# Patient Record
Sex: Female | Born: 1950 | Race: Black or African American | Hispanic: No | Marital: Married | Smoking: Never smoker
Health system: Southern US, Community
[De-identification: ages and names within clinical notes are randomized; demographics above are authoritative.]

## PROBLEM LIST (undated history)

## (undated) DIAGNOSIS — M7540 Impingement syndrome of unspecified shoulder: Secondary | ICD-10-CM

## (undated) DIAGNOSIS — M25819 Other specified joint disorders, unspecified shoulder: Secondary | ICD-10-CM

## (undated) DIAGNOSIS — N189 Chronic kidney disease, unspecified: Secondary | ICD-10-CM

## (undated) DIAGNOSIS — E119 Type 2 diabetes mellitus without complications: Secondary | ICD-10-CM

## (undated) HISTORY — PX: CHOLECYSTECTOMY: SHX55

## (undated) HISTORY — PX: ROTATOR CUFF REPAIR: SHX139

## (undated) HISTORY — PX: ABDOMINAL HYSTERECTOMY: SHX81

## (undated) HISTORY — DX: Type 2 diabetes mellitus without complications: E11.9

---

## 1998-08-12 ENCOUNTER — Encounter: Payer: Self-pay | Admitting: General Surgery

## 1998-08-16 ENCOUNTER — Ambulatory Visit (HOSPITAL_COMMUNITY): Admission: RE | Admit: 1998-08-16 | Discharge: 1998-08-17 | Payer: Self-pay | Admitting: General Surgery

## 1999-09-16 ENCOUNTER — Encounter: Payer: Self-pay | Admitting: Emergency Medicine

## 1999-09-16 ENCOUNTER — Emergency Department (HOSPITAL_COMMUNITY): Admission: EM | Admit: 1999-09-16 | Discharge: 1999-09-16 | Payer: Self-pay | Admitting: Emergency Medicine

## 2002-10-31 ENCOUNTER — Other Ambulatory Visit: Admission: RE | Admit: 2002-10-31 | Discharge: 2002-10-31 | Payer: Self-pay | Admitting: Internal Medicine

## 2003-08-03 ENCOUNTER — Encounter: Admission: RE | Admit: 2003-08-03 | Discharge: 2003-11-01 | Payer: Self-pay | Admitting: Internal Medicine

## 2003-08-19 ENCOUNTER — Encounter: Admission: RE | Admit: 2003-08-19 | Discharge: 2003-08-19 | Payer: Self-pay | Admitting: Internal Medicine

## 2003-12-29 ENCOUNTER — Ambulatory Visit (HOSPITAL_BASED_OUTPATIENT_CLINIC_OR_DEPARTMENT_OTHER): Admission: RE | Admit: 2003-12-29 | Discharge: 2003-12-29 | Payer: Self-pay | Admitting: Orthopaedic Surgery

## 2003-12-29 ENCOUNTER — Ambulatory Visit (HOSPITAL_COMMUNITY): Admission: RE | Admit: 2003-12-29 | Discharge: 2003-12-29 | Payer: Self-pay | Admitting: Orthopaedic Surgery

## 2006-06-18 ENCOUNTER — Ambulatory Visit (HOSPITAL_COMMUNITY): Admission: RE | Admit: 2006-06-18 | Discharge: 2006-06-18 | Payer: Self-pay | Admitting: Internal Medicine

## 2006-06-29 ENCOUNTER — Encounter: Admission: RE | Admit: 2006-06-29 | Discharge: 2006-06-29 | Payer: Self-pay | Admitting: Internal Medicine

## 2007-08-29 ENCOUNTER — Ambulatory Visit (HOSPITAL_COMMUNITY): Admission: RE | Admit: 2007-08-29 | Discharge: 2007-08-29 | Payer: Self-pay | Admitting: Gastroenterology

## 2008-06-30 ENCOUNTER — Encounter: Admission: RE | Admit: 2008-06-30 | Discharge: 2008-06-30 | Payer: Self-pay | Admitting: Internal Medicine

## 2008-10-11 ENCOUNTER — Emergency Department (HOSPITAL_COMMUNITY): Admission: EM | Admit: 2008-10-11 | Discharge: 2008-10-12 | Payer: Self-pay | Admitting: Emergency Medicine

## 2009-12-16 ENCOUNTER — Emergency Department (HOSPITAL_COMMUNITY): Admission: EM | Admit: 2009-12-16 | Discharge: 2009-12-16 | Payer: Self-pay | Admitting: Emergency Medicine

## 2010-02-07 ENCOUNTER — Ambulatory Visit (HOSPITAL_COMMUNITY): Admission: EM | Admit: 2010-02-07 | Discharge: 2010-02-07 | Payer: Self-pay | Admitting: Emergency Medicine

## 2010-02-07 ENCOUNTER — Emergency Department (HOSPITAL_COMMUNITY)
Admission: EM | Admit: 2010-02-07 | Discharge: 2010-02-07 | Payer: Self-pay | Source: Home / Self Care | Admitting: Emergency Medicine

## 2010-07-02 ENCOUNTER — Encounter: Payer: Self-pay | Admitting: Internal Medicine

## 2010-07-03 ENCOUNTER — Encounter: Payer: Self-pay | Admitting: Internal Medicine

## 2010-08-26 LAB — CBC
HCT: 35.2 % — ABNORMAL LOW (ref 36.0–46.0)
Hemoglobin: 11.9 g/dL — ABNORMAL LOW (ref 12.0–15.0)
MCHC: 33.8 g/dL (ref 30.0–36.0)
RBC: 3.96 MIL/uL (ref 3.87–5.11)
RDW: 12.9 % (ref 11.5–15.5)

## 2010-08-26 LAB — DIFFERENTIAL
Basophils Absolute: 0.1 10*3/uL (ref 0.0–0.1)
Eosinophils Absolute: 0.2 10*3/uL (ref 0.0–0.7)
Eosinophils Relative: 2 % (ref 0–5)
Lymphocytes Relative: 47 % — ABNORMAL HIGH (ref 12–46)
Monocytes Absolute: 0.7 10*3/uL (ref 0.1–1.0)
Monocytes Relative: 7 % (ref 3–12)
Neutro Abs: 4.5 10*3/uL (ref 1.7–7.7)

## 2010-08-26 LAB — BASIC METABOLIC PANEL
CO2: 26 mEq/L (ref 19–32)
Chloride: 108 mEq/L (ref 96–112)
Creatinine, Ser: 0.95 mg/dL (ref 0.4–1.2)

## 2010-08-26 LAB — URINALYSIS, ROUTINE W REFLEX MICROSCOPIC
Bilirubin Urine: NEGATIVE
Glucose, UA: NEGATIVE mg/dL
Ketones, ur: NEGATIVE mg/dL
Urobilinogen, UA: 1 mg/dL (ref 0.0–1.0)

## 2010-08-28 LAB — BASIC METABOLIC PANEL
BUN: 15 mg/dL (ref 6–23)
Calcium: 9.1 mg/dL (ref 8.4–10.5)
Chloride: 107 mEq/L (ref 96–112)
GFR calc non Af Amer: >60 mL/min (ref >60)
Glucose, Bld: 186 mg/dL — ABNORMAL HIGH (ref 70–99)
Sodium: 141 mEq/L (ref 135–145)

## 2010-08-28 LAB — URINALYSIS, ROUTINE W REFLEX MICROSCOPIC
Bilirubin Urine: NEGATIVE
Ketones, ur: NEGATIVE mg/dL
Protein, ur: NEGATIVE mg/dL
Urobilinogen, UA: 0.2 mg/dL (ref 0.0–1.0)

## 2010-08-28 LAB — DIFFERENTIAL
Basophils Absolute: 0.1 10*3/uL (ref 0.0–0.1)
Eosinophils Relative: 1 % (ref 0–5)
Neutro Abs: 10.2 10*3/uL — ABNORMAL HIGH (ref 1.7–7.7)

## 2010-08-28 LAB — CBC
HCT: 34.5 % — ABNORMAL LOW (ref 36.0–46.0)
RBC: 3.89 MIL/uL (ref 3.87–5.11)

## 2010-08-28 LAB — URINE MICROSCOPIC-ADD ON

## 2010-09-20 LAB — URINE MICROSCOPIC-ADD ON

## 2010-09-20 LAB — POCT I-STAT, CHEM 8
BUN: 14 mg/dL (ref 6–23)
Calcium, Ion: 1.19 mmol/L (ref 1.12–1.32)
Chloride: 103 mEq/L (ref 96–112)
Creatinine, Ser: 1 mg/dL (ref 0.4–1.2)
Glucose, Bld: 130 mg/dL — ABNORMAL HIGH (ref 70–99)
HCT: 39 % (ref 36.0–46.0)
Potassium: 3.4 mEq/L — ABNORMAL LOW (ref 3.5–5.1)
TCO2: 29 mmol/L (ref 0–100)

## 2010-09-20 LAB — URINALYSIS, ROUTINE W REFLEX MICROSCOPIC
Bilirubin Urine: NEGATIVE
Glucose, UA: NEGATIVE mg/dL
Ketones, ur: NEGATIVE mg/dL
Protein, ur: 30 mg/dL — AB
Specific Gravity, Urine: 1.035 — ABNORMAL HIGH (ref 1.005–1.030)
pH: 5 (ref 5.0–8.0)

## 2010-10-28 NOTE — Op Note (Signed)
Heidi Powers, Heidi Powers                       ACCOUNT NO.:  [id number]   MEDICAL RECORD NO.:  [id number]                   PATIENT TYPE:  AMB   LOCATION:  DSC                                  FACILITY:  [facility]   PHYSICIAN:  Lubertha Basque. Jerl Santos, M.D.             DATE OF BIRTH:  May 19, 1951   DATE OF PROCEDURE:  12/29/2003  DATE OF DISCHARGE:                                 OPERATIVE REPORT   PREOPERATIVE DIAGNOSES:  1. Right shoulder impingement.  2. Right shoulder acromioclavicular joint degeneration.  3. Right shoulder rotator cuff tear.   POSTOPERATIVE DIAGNOSES:  1. Right shoulder impingement.  2. Right shoulder acromioclavicular joint degeneration.  3. Right shoulder rotator cuff tear.  4. Biceps degeneration.   PROCEDURES:  1. Right shoulder arthroscopic acromioplasty.  2. Right shoulder arthroscopic acromioclavicular joint resection.  3. Right shoulder arthroscopic rotator cuff repair.  4. Right shoulder arthroscopic biceps tenotomy.   ANESTHESIA:  General and block.   ATTENDING SURGEON:  Lubertha Basque. Jerl Santos, M.D.   ASSISTANT:  Lindwood Qua, P.A.   INDICATION FOR PROCEDURE:  The patient is a 60 year old woman with many  months of intense right shoulder pain.  This has persisted despite oral anti-  inflammatories and an exercise program.  She has achieved no relief with a  subacromial injection.  An MRI scan shows a small supraspinatus rotator cuff  tear, complete, along with AC degeneration and impingement.  With pain at  rest and pain with activity, she is offered an arthroscopy.  Informed  operative consent was obtained after a discussion of possible complications  of, reaction to anesthesia, and infection.   DESCRIPTION OF PROCEDURE:  The patient was taken to an operating suite,  where a general anesthetic was applied without difficulty.  She was also  given a block in the preanesthesia area.  She was positioned in beach chair  position and prepped and draped in  a normal sterile fashion.  After the  administration of preop Clindamycin, an arthroscopy was done through a total  of three portals.  Glenohumeral joint showed no degenerative changes, while  the biceps tendon appeared significantly degenerative throughout the intra-  articular portion.  There was a longitudinal split in this tendon.  I  attempted a debridement but was left with about 50% of the thickness of the  tendon as being relatively healthy.  As she achieved no relief with her  subacromial injection, it seemed wise to go ahead and perform a tenotomy,  releasing the biceps tendon.  A shaver was used and this easily was  performed.  The rotator cuff did appear to have a small tear near the  interval between the supraspinatus and subscapularis tendons.  In the  subacromial space she had things consistent with impingement, and an  acromioplasty was done with the bur in the lateral position followed by  transfer of the bur to the posterior position.  She also had bony contact  at  the [facility] joint and a formal ACD compression was done through an anterior  portal with an arthroscopic bur.  The rotator cuff tear was small and could  be seen from above.  A bleeding bed of bone was created with a bur, followed  by the use of one of the Arthrex parachute anchors to repair this tendon to  the humeral head at the greater tuberosity.  The shoulder was thoroughly  irrigated, followed by placement of Marcaine with epinephrine.  Simple  sutures of nylon were used to loosely reapproximate the portals, followed by  Adaptic and a dry gauze dressing with tape.  Estimated blood loss and  intraoperative fluids can be obtained from anesthesia records.   DISPOSITION:  The patient was extubated in the operating room and taken to  the recovery room in stable condition.  Plans were for her to go home the  same day and follow up in less than a week.  I will contact her by phone  tonight.                                                Lubertha Basque Jerl Santos, M.D.    PGD/MEDQ  D:  12/29/2003  T:  12/29/2003  Job:  [id number]

## 2010-12-30 ENCOUNTER — Other Ambulatory Visit: Payer: Self-pay | Admitting: Internal Medicine

## 2010-12-30 DIAGNOSIS — Z1231 Encounter for screening mammogram for malignant neoplasm of breast: Secondary | ICD-10-CM

## 2011-01-20 ENCOUNTER — Ambulatory Visit: Payer: Self-pay

## 2012-02-19 ENCOUNTER — Ambulatory Visit: Payer: Self-pay

## 2012-03-01 ENCOUNTER — Ambulatory Visit
Admission: RE | Admit: 2012-03-01 | Discharge: 2012-03-01 | Disposition: A | Discharge status: Home / Self Care | Payer: BC Managed Care – PPO | Source: Ambulatory Visit | Attending: Internal Medicine | Admitting: Internal Medicine

## 2012-03-01 DIAGNOSIS — Z1231 Encounter for screening mammogram for malignant neoplasm of breast: Secondary | ICD-10-CM

## 2013-02-25 ENCOUNTER — Other Ambulatory Visit: Payer: Self-pay

## 2013-02-25 DIAGNOSIS — Z1231 Encounter for screening mammogram for malignant neoplasm of breast: Secondary | ICD-10-CM

## 2013-03-20 ENCOUNTER — Ambulatory Visit
Admission: RE | Admit: 2013-03-20 | Discharge: 2013-03-20 | Disposition: A | Discharge status: Home / Self Care | Payer: BC Managed Care – PPO | Source: Ambulatory Visit

## 2013-03-20 ENCOUNTER — Ambulatory Visit: Payer: BC Managed Care – PPO

## 2013-03-20 DIAGNOSIS — Z1231 Encounter for screening mammogram for malignant neoplasm of breast: Secondary | ICD-10-CM

## 2014-06-17 ENCOUNTER — Encounter: Payer: 59 | Attending: Internal Medicine | Admitting: Dietician

## 2014-06-17 ENCOUNTER — Encounter: Payer: Self-pay | Admitting: Dietician

## 2014-06-17 VITALS — Ht 60.5 in | Wt 166.0 lb

## 2014-06-17 DIAGNOSIS — Z6831 Body mass index (BMI) 31.0-31.9, adult: Secondary | ICD-10-CM | POA: Diagnosis not present

## 2014-06-17 DIAGNOSIS — E669 Obesity, unspecified: Secondary | ICD-10-CM | POA: Diagnosis not present

## 2014-06-17 DIAGNOSIS — Z713 Dietary counseling and surveillance: Secondary | ICD-10-CM | POA: Diagnosis not present

## 2014-06-17 NOTE — Progress Notes (Signed)
  Medical Nutrition Therapy:  Appt start time: 0810end time:  0910.   Assessment:  Primary concerns: Heidi Powers is here today regarding her diagnosis of pre-diabetes with an elevated HgbA1c of 6.2 in November. She has made several changes since November including cutting out lemonade, and not eating at [place]. She has stopped eating her sweet potato pies. She is getting grilled options and diet lemonade at restaurants. She has lost 4lbs over the last 3 months. She works 3p-11, Monday and Tuesday and is retired. She does the food shopping and her husband does the food preparation. Her husband has diabetes.   Preferred Learning Style:   No preference indicated   Learning Readiness:   Ready  MEDICATIONS: see list   DIETARY INTAKE:  Usual eating pattern includes 3 meals and 0-1 snacks per day.  Avoided foods include cottage cheese.    24-hr recall:  B ( AM): not hungry til 11- eats cereal or [place]: biscuit with egg and bacon with small orange juice (sometimes)  Snk ( AM): none but rarely  L ( PM): subway 6 inch roast beef and light mayo Snk ( PM): lean shake D ( PM): pita delite salad with less dressing or salmon with greens or string beans Snk ( PM): sometimes a banana or peeled apples, pickles Beverages: water now   Usual physical activity: none now  Estimated energy needs: 1600 calories 180 g carbohydrates 120 g protein 44 g fat  Progress Towards Goal(s):  In progress.   Nutritional Diagnosis:  NB-1.1 Food and nutrition-related knowledge deficit As related to history of excess consumption of carbohydrates and lack of exercise.  As evidenced by her 24 hour diet recall, self report on activity and laboratory values.    Intervention:  Nutrition counseling and education provided: Goals:  Follow Diabetes Meal Plan as instructed  Eat 3 meals and 0-2 snacks, every 3-5 hrs  Limit carbohydrate intake to 2-3 carb choices (30-45 grams) carbohydrate per meal  Limit  carbohydrate intake to 0-1 carb choices (0-15 grams) carbohydrate per snack  Add lean protein foods to meals and snacks  Aim for 15 mins of walking or bike 3 times per week and gradually increase as tolerated  Teaching Method Utilized:  Visual Auditory Hands on  Handouts given during visit include:  Yellow Card  *Living Well with Diabetes  MyPlate  Blood Glucose Monitoring  15g CHO sheet  Barriers to learning/adherence to lifestyle change: none  Demonstrated degree of understanding via:  Teach Back   Monitoring/Evaluation:  Dietary intake, exercise, laboratory values, and body weight in 3 month(s).

## 2014-06-17 NOTE — Patient Instructions (Signed)
Goals:  Follow Diabetes Meal Plan as instructed  Eat 3 meals and 0-2 snacks, every 3-5 hrs  Limit carbohydrate intake to 2-3 carb choices (30-45 grams) carbohydrate per meal  Limit carbohydrate intake to 0-1 carb choices (0-15 grams) carbohydrate per snack  Add lean protein foods to meals and snacks  Aim for 15 mins of walking or bike 3 times per week and gradually increase as tolerated

## 2014-09-16 ENCOUNTER — Ambulatory Visit: Payer: 59 | Admitting: Dietician

## 2014-11-20 ENCOUNTER — Other Ambulatory Visit: Payer: Self-pay | Admitting: Orthopaedic Surgery

## 2014-12-03 ENCOUNTER — Other Ambulatory Visit: Payer: Self-pay

## 2014-12-03 DIAGNOSIS — Z1231 Encounter for screening mammogram for malignant neoplasm of breast: Secondary | ICD-10-CM

## 2014-12-08 ENCOUNTER — Encounter (HOSPITAL_BASED_OUTPATIENT_CLINIC_OR_DEPARTMENT_OTHER): Payer: Self-pay | Admitting: *Deleted

## 2014-12-09 ENCOUNTER — Ambulatory Visit
Admission: RE | Admit: 2014-12-09 | Discharge: 2014-12-09 | Disposition: A | Discharge status: Home / Self Care | Payer: 59 | Source: Ambulatory Visit

## 2014-12-09 DIAGNOSIS — Z1231 Encounter for screening mammogram for malignant neoplasm of breast: Secondary | ICD-10-CM

## 2014-12-09 NOTE — H&P (Signed)
Heidi Powers is an 64 y.o. female.   Chief Complaint: Left shoulder pain HPI: Heidi Powers continues over a long period of time with terrible left shoulder pain.  Worse in the anterior aspect.  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  MRI scan dated 10/29/14 shows some irritation of the rotator cuff and the complete biceps tear.  We have discussed proceeding with an arthroscopy and try to calm this down and make herMore comfortable with activities of daily living  Past Medical History  Diagnosis Date  . Chronic kidney disease     H/O kidney stones  . Shoulder impingement     left    Past Surgical History  Procedure Laterality Date  . Abdominal hysterectomy    . Cholecystectomy    . Rotator cuff repair Right     History reviewed. No pertinent family history. Social History:  reports that she has never smoked. She does not have any smokeless tobacco history on file. She reports that she does not drink alcohol or use illicit drugs.  Allergies:  Allergies  Allergen Reactions  . Penicillins     No prescriptions prior to admission    No results found for this or any previous visit (from the past 48 hour(s)). No results found.  Review of Systems  Constitutional: Negative.   HENT: Negative.   Eyes: Negative.   Respiratory: Negative.   Cardiovascular: Negative.   Gastrointestinal: Negative.   Genitourinary: Negative.   Musculoskeletal: Positive for joint pain.  Skin: Negative.   Neurological: Negative.   Endo/Heme/Allergies: Negative.   Psychiatric/Behavioral: Negative.     Height 5\' 1"  (1.549 m), weight 74.844 kg (165 lb). Physical Exam  Constitutional: She appears well-nourished.  HENT:  Head: Atraumatic.  Eyes: Conjunctivae are normal.  Neck: Normal range of motion.  Cardiovascular: Normal rate and normal heart sounds.    Respiratory: Effort normal.  GI: Soft.  Musculoskeletal:  Left shoulder exam motion is relatively good 1:30, 45, L1 positive impingement in both positions and some pain at the biceps region anteriorly cuff is strong but painful.  No a.c. pain  Neurological: She is alert.  Skin: Skin is warm.  Psychiatric: She has a normal mood and affect.     Assessment/Plan Assessment: Left shoulder impingement and biceps tear with secondary adhesive capsulitis injected 09/23/14 Plan: We have discussed with her going forward with manipulation and arthroscopy that include the risk of anesthesia and infection and need for outpatient physical therapy postoperatively to maximize the results.  Aldric Wenzler R 12/09/2014, 12:01 PM

## 2014-12-11 ENCOUNTER — Encounter (HOSPITAL_BASED_OUTPATIENT_CLINIC_OR_DEPARTMENT_OTHER): Payer: Self-pay | Admitting: *Deleted

## 2014-12-11 ENCOUNTER — Ambulatory Visit (HOSPITAL_BASED_OUTPATIENT_CLINIC_OR_DEPARTMENT_OTHER): Payer: 59 | Admitting: Anesthesiology

## 2014-12-11 ENCOUNTER — Encounter (HOSPITAL_BASED_OUTPATIENT_CLINIC_OR_DEPARTMENT_OTHER)
Admission: RE | Disposition: A | Discharge status: Home / Self Care | Payer: Self-pay | Source: Ambulatory Visit | Attending: Orthopaedic Surgery

## 2014-12-11 ENCOUNTER — Ambulatory Visit (HOSPITAL_BASED_OUTPATIENT_CLINIC_OR_DEPARTMENT_OTHER)
Admission: RE | Admit: 2014-12-11 | Discharge: 2014-12-11 | Disposition: A | Discharge status: Home / Self Care | Payer: 59 | Source: Ambulatory Visit | Attending: Orthopaedic Surgery | Admitting: Orthopaedic Surgery

## 2014-12-11 DIAGNOSIS — Y939 Activity, unspecified: Secondary | ICD-10-CM | POA: Diagnosis not present

## 2014-12-11 DIAGNOSIS — S46222A Laceration of muscle, fascia and tendon of other parts of biceps, left arm, initial encounter: Secondary | ICD-10-CM | POA: Insufficient documentation

## 2014-12-11 DIAGNOSIS — M7502 Adhesive capsulitis of left shoulder: Secondary | ICD-10-CM | POA: Insufficient documentation

## 2014-12-11 DIAGNOSIS — Y999 Unspecified external cause status: Secondary | ICD-10-CM | POA: Diagnosis not present

## 2014-12-11 DIAGNOSIS — M94212 Chondromalacia, left shoulder: Secondary | ICD-10-CM | POA: Diagnosis not present

## 2014-12-11 DIAGNOSIS — N189 Chronic kidney disease, unspecified: Secondary | ICD-10-CM | POA: Insufficient documentation

## 2014-12-11 DIAGNOSIS — M25812 Other specified joint disorders, left shoulder: Secondary | ICD-10-CM | POA: Diagnosis present

## 2014-12-11 DIAGNOSIS — Y929 Unspecified place or not applicable: Secondary | ICD-10-CM | POA: Diagnosis not present

## 2014-12-11 DIAGNOSIS — X58XXXA Exposure to other specified factors, initial encounter: Secondary | ICD-10-CM | POA: Diagnosis not present

## 2014-12-11 HISTORY — DX: Chronic kidney disease, unspecified: N18.9

## 2014-12-11 HISTORY — DX: Other specified joint disorders, unspecified shoulder: M25.819

## 2014-12-11 HISTORY — PX: SHOULDER ARTHROSCOPY: SHX128

## 2014-12-11 HISTORY — PX: SHOULDER ACROMIOPLASTY: SHX6093

## 2014-12-11 HISTORY — DX: Impingement syndrome of unspecified shoulder: M75.40

## 2014-12-11 LAB — POCT HEMOGLOBIN-HEMACUE: Hemoglobin: 12.7 g/dL (ref 12.0–15.0)

## 2014-12-11 SURGERY — ARTHROSCOPY, SHOULDER
Anesthesia: General | Site: Shoulder | Laterality: Left

## 2014-12-11 MED ORDER — FENTANYL CITRATE (PF) 100 MCG/2ML IJ SOLN
50.0000 ug | INTRAMUSCULAR | Status: AC | PRN
  Administered 2014-12-11: 25 ug via INTRAVENOUS
  Administered 2014-12-11: 50 ug via INTRAVENOUS
  Administered 2014-12-11: 25 ug via INTRAVENOUS

## 2014-12-11 MED ORDER — HYDROCODONE-ACETAMINOPHEN 5-325 MG PO TABS: 1.0000 | ORAL_TABLET | Freq: Four times a day (QID) | ORAL | Status: DC | PRN

## 2014-12-11 MED ORDER — SUCCINYLCHOLINE CHLORIDE 20 MG/ML IJ SOLN
INTRAMUSCULAR | Status: DC | PRN
  Administered 2014-12-11: 100 mg via INTRAVENOUS

## 2014-12-11 MED ORDER — GLYCOPYRROLATE 0.2 MG/ML IJ SOLN: 0.2000 mg | Freq: Once | INTRAMUSCULAR | Status: DC | PRN

## 2014-12-11 MED ORDER — PROPOFOL 10 MG/ML IV BOLUS
INTRAVENOUS | Status: DC | PRN
  Administered 2014-12-11: 200 mg via INTRAVENOUS

## 2014-12-11 MED ORDER — LIDOCAINE HCL (CARDIAC) 20 MG/ML IV SOLN
INTRAVENOUS | Status: DC | PRN
Start: 2014-12-11 — End: 2014-12-11
  Administered 2014-12-11: 60 mg via INTRAVENOUS

## 2014-12-11 MED ORDER — FENTANYL CITRATE (PF) 100 MCG/2ML IJ SOLN
INTRAMUSCULAR | Status: AC
  Filled 2014-12-11: qty 2

## 2014-12-11 MED ORDER — SCOPOLAMINE 1 MG/3DAYS TD PT72: 1.0000 | MEDICATED_PATCH | Freq: Once | TRANSDERMAL | Status: DC | PRN

## 2014-12-11 MED ORDER — CHLORHEXIDINE GLUCONATE 4 % EX LIQD: 60.0000 mL | Freq: Once | CUTANEOUS | Status: DC

## 2014-12-11 MED ORDER — LACTATED RINGERS IV SOLN
INTRAVENOUS | Status: DC
  Administered 2014-12-11 (×2): via INTRAVENOUS

## 2014-12-11 MED ORDER — VANCOMYCIN HCL IN DEXTROSE 1-5 GM/200ML-% IV SOLN
INTRAVENOUS | Status: AC
  Filled 2014-12-11: qty 200

## 2014-12-11 MED ORDER — DEXAMETHASONE SODIUM PHOSPHATE 4 MG/ML IJ SOLN
INTRAMUSCULAR | Status: DC | PRN
  Administered 2014-12-11: 10 mg via INTRAVENOUS

## 2014-12-11 MED ORDER — MIDAZOLAM HCL 2 MG/2ML IJ SOLN
INTRAMUSCULAR | Status: AC
  Filled 2014-12-11: qty 2

## 2014-12-11 MED ORDER — VANCOMYCIN HCL IN DEXTROSE 1-5 GM/200ML-% IV SOLN
1000.0000 mg | INTRAVENOUS | Status: AC
  Administered 2014-12-11: 1000 mg via INTRAVENOUS

## 2014-12-11 MED ORDER — MIDAZOLAM HCL 2 MG/2ML IJ SOLN
1.0000 mg | INTRAMUSCULAR | Status: DC | PRN
  Administered 2014-12-11: 2 mg via INTRAVENOUS

## 2014-12-11 MED ORDER — LACTATED RINGERS IV SOLN
INTRAVENOUS | Status: DC
  Administered 2014-12-11: 13:00:00 via INTRAVENOUS

## 2014-12-11 SURGICAL SUPPLY — 67 items
APL SKNCLS STERI-STRIP NONHPOA (GAUZE/BANDAGES/DRESSINGS)
BENZOIN TINCTURE PRP APPL 2/3 (GAUZE/BANDAGES/DRESSINGS) IMPLANT
BLADE CUDA 5.5 (BLADE) IMPLANT
BLADE GREAT WHITE 4.2 (BLADE) ×2 IMPLANT
BLADE SURG 15 STRL LF DISP TIS (BLADE) IMPLANT
BLADE SURG 15 STRL SS (BLADE)
BUR VERTEX HOODED 4.5 (BURR) IMPLANT
CANNULA SHOULDER 7CM (CANNULA) ×2 IMPLANT
CANNULA TWIST IN 8.25X7CM (CANNULA) IMPLANT
DECANTER SPIKE VIAL GLASS SM (MISCELLANEOUS) IMPLANT
DRAPE STERI 35X30 U-POUCH (DRAPES) ×2 IMPLANT
DRAPE U-SHAPE 47X51 STRL (DRAPES) ×2 IMPLANT
DRAPE U-SHAPE 76X120 STRL (DRAPES) ×4 IMPLANT
DRSG EMULSION OIL 3X3 NADH (GAUZE/BANDAGES/DRESSINGS) ×2 IMPLANT
DRSG PAD ABDOMINAL 8X10 ST (GAUZE/BANDAGES/DRESSINGS) ×2 IMPLANT
DURAPREP 26ML APPLICATOR (WOUND CARE) ×2 IMPLANT
ELECT MENISCUS 165MM 90D (ELECTRODE) IMPLANT
ELECT REM PT RETURN 9FT ADLT (ELECTROSURGICAL) ×2
ELECTRODE REM PT RTRN 9FT ADLT (ELECTROSURGICAL) ×1 IMPLANT
GAUZE SPONGE 4X4 12PLY STRL (GAUZE/BANDAGES/DRESSINGS) ×2 IMPLANT
GLOVE BIO SURGEON STRL SZ 6.5 (GLOVE) ×1 IMPLANT
GLOVE BIO SURGEON STRL SZ8 (GLOVE) ×4 IMPLANT
GLOVE BIOGEL PI IND STRL 7.0 (GLOVE) IMPLANT
GLOVE BIOGEL PI IND STRL 8 (GLOVE) ×2 IMPLANT
GLOVE BIOGEL PI INDICATOR 7.0 (GLOVE) ×2
GLOVE BIOGEL PI INDICATOR 8 (GLOVE) ×2
GOWN STRL REUS W/ TWL LRG LVL3 (GOWN DISPOSABLE) ×2 IMPLANT
GOWN STRL REUS W/ TWL XL LVL3 (GOWN DISPOSABLE) ×2 IMPLANT
GOWN STRL REUS W/TWL LRG LVL3 (GOWN DISPOSABLE) ×4
GOWN STRL REUS W/TWL XL LVL3 (GOWN DISPOSABLE) ×4
LIQUID BAND (GAUZE/BANDAGES/DRESSINGS) IMPLANT
MANIFOLD NEPTUNE II (INSTRUMENTS) ×2 IMPLANT
NDL SCORPION MULTI FIRE (NEEDLE) IMPLANT
NDL SUT 6 .5 CRC .975X.05 MAYO (NEEDLE) IMPLANT
NEEDLE MAYO TAPER (NEEDLE)
NEEDLE SCORPION MULTI FIRE (NEEDLE) IMPLANT
NS IRRIG 1000ML POUR BTL (IV SOLUTION) IMPLANT
PACK ARTHROSCOPY DSU (CUSTOM PROCEDURE TRAY) ×2 IMPLANT
PACK BASIN DAY SURGERY FS (CUSTOM PROCEDURE TRAY) ×2 IMPLANT
PASSER SUT SWANSON 36MM LOOP (INSTRUMENTS) IMPLANT
PENCIL BUTTON HOLSTER BLD 10FT (ELECTRODE) IMPLANT
SET ARTHROSCOPY TUBING (MISCELLANEOUS) ×2
SET ARTHROSCOPY TUBING LN (MISCELLANEOUS) ×1 IMPLANT
SHEET MEDIUM DRAPE 40X70 STRL (DRAPES) ×2 IMPLANT
SLEEVE SCD COMPRESS KNEE MED (MISCELLANEOUS) ×1 IMPLANT
SLING ARM LRG ADULT FOAM STRAP (SOFTGOODS) ×1 IMPLANT
SLING ARM MED ADULT FOAM STRAP (SOFTGOODS) IMPLANT
SLING ARM SM FOAM STRAP (SOFTGOODS) IMPLANT
SLING ARM XL FOAM STRAP (SOFTGOODS) IMPLANT
SPONGE LAP 4X18 X RAY DECT (DISPOSABLE) IMPLANT
STRIP CLOSURE SKIN 1/2X4 (GAUZE/BANDAGES/DRESSINGS) IMPLANT
SUCTION FRAZIER TIP 10 FR DISP (SUCTIONS) IMPLANT
SUT ETHIBOND 2 OS 4 DA (SUTURE) IMPLANT
SUT ETHILON 3 0 PS 1 (SUTURE) ×2 IMPLANT
SUT FIBERWIRE #2 38 T-5 BLUE (SUTURE)
SUT PDS AB 2-0 CT2 27 (SUTURE) IMPLANT
SUT VIC AB 0 SH 27 (SUTURE) IMPLANT
SUT VIC AB 2-0 SH 27 (SUTURE)
SUT VIC AB 2-0 SH 27XBRD (SUTURE) IMPLANT
SUT VICRYL 4-0 PS2 18IN ABS (SUTURE) IMPLANT
SUTURE FIBERWR #2 38 T-5 BLUE (SUTURE) IMPLANT
SYR BULB 3OZ (MISCELLANEOUS) IMPLANT
TOWEL OR 17X24 6PK STRL BLUE (TOWEL DISPOSABLE) ×2 IMPLANT
TOWEL OR NON WOVEN STRL DISP B (DISPOSABLE) ×2 IMPLANT
WAND STAR VAC 90 (SURGICAL WAND) ×2 IMPLANT
WATER STERILE IRR 1000ML POUR (IV SOLUTION) ×2 IMPLANT
YANKAUER SUCT BULB TIP NO VENT (SUCTIONS) IMPLANT

## 2014-12-11 NOTE — Anesthesia Procedure Notes (Signed)
Procedure Name: Intubation Date/Time: 12/11/2014 1:44 PM Performed by: Maryella Shivers Pre-anesthesia Checklist: Patient identified, Emergency Drugs available, Suction available and Patient being monitored Patient Re-evaluated:Patient Re-evaluated prior to inductionOxygen Delivery Method: Circle System Utilized Preoxygenation: Pre-oxygenation with 100% oxygen Intubation Type: IV induction Ventilation: Mask ventilation without difficulty Laryngoscope Size: Mac and 3 Grade View: Grade I Tube type: Oral Tube size: 7.0 mm Number of attempts: 1 Airway Equipment and Method: Stylet and Oral airway Placement Confirmation: ETT inserted through vocal cords under direct vision,  positive ETCO2 and breath sounds checked- equal and bilateral Secured at: 21 cm Tube secured with: Tape Dental Injury: Teeth and Oropharynx as per pre-operative assessment

## 2014-12-11 NOTE — Progress Notes (Signed)
Assisted Dr. Germeroth with left, ultrasound guided, interscalene  block. Side rails up, monitors on throughout procedure. See vital signs in flow sheet. Tolerated Procedure well. 

## 2014-12-11 NOTE — Transfer of Care (Signed)
Immediate Anesthesia Transfer of Care Note  Patient: Heidi Powers  Procedure(s) Performed: Procedure(s): LEFT SHOULDER ARTHROSCOPY WITH DEBRIDEMENT (Left) SHOULDER ACROMIOPLASTY (Left)  Patient Location: PACU  Anesthesia Type:GA combined with regional for post-op pain  Level of Consciousness: awake, alert  and oriented  Airway & Oxygen Therapy: Patient Spontanous Breathing and Patient connected to face mask oxygen  Post-op Assessment: Report given to RN and Post -op Vital signs reviewed and stable  Post vital signs: Reviewed and stable  Last Vitals:  Filed Vitals:   12/11/14 1330  BP: 111/46  Pulse: 67  Temp:   Resp: 12    Complications: No apparent anesthesia complications

## 2014-12-11 NOTE — Interval H&P Note (Signed)
OK for surgery PD 

## 2014-12-11 NOTE — Discharge Instructions (Signed)
°  Post Anesthesia Home Care Instructions ° °Activity: °Get plenty of rest for the remainder of the day. A responsible adult should stay with you for 24 hours following the procedure.  °For the next 24 hours, DO NOT: °-Drive a car °-Operate machinery °-Drink alcoholic beverages °-Take any medication unless instructed by your physician °-Make any legal decisions or sign important papers. ° °Meals: °Start with liquid foods such as gelatin or soup. Progress to regular foods as tolerated. Avoid greasy, spicy, heavy foods. If nausea and/or vomiting occur, drink only clear liquids until the nausea and/or vomiting subsides. Call your physician if vomiting continues. ° °Special Instructions/Symptoms: °Your throat may feel dry or sore from the anesthesia or the breathing tube placed in your throat during surgery. If this causes discomfort, gargle with warm salt water. The discomfort should disappear within 24 hours. ° °If you had a scopolamine patch placed behind your ear for the management of post- operative nausea and/or vomiting: ° °1. The medication in the patch is effective for 72 hours, after which it should be removed.  Wrap patch in a tissue and discard in the trash. Wash hands thoroughly with soap and water. °2. You may remove the patch earlier than 72 hours if you experience unpleasant side effects which may include dry mouth, dizziness or visual disturbances. °3. Avoid touching the patch. Wash your hands with soap and water after contact with the patch. °  °Regional Anesthesia Blocks ° °1. Numbness or the inability to move the "blocked" extremity may last from 3-48 hours after placement. The length of time depends on the medication injected and your individual response to the medication. If the numbness is not going away after 48 hours, call your surgeon. ° °2. The extremity that is blocked will need to be protected until the numbness is gone and the  Strength has returned. Because you cannot feel it, you will need  to take extra care to avoid injury. Because it may be weak, you may have difficulty moving it or using it. You may not know what position it is in without looking at it while the block is in effect. ° °3. For blocks in the legs and feet, returning to weight bearing and walking needs to be done carefully. You will need to wait until the numbness is entirely gone and the strength has returned. You should be able to move your leg and foot normally before you try and bear weight or walk. You will need someone to be with you when you first try to ensure you do not fall and possibly risk injury. ° °4. Bruising and tenderness at the needle site are common side effects and will resolve in a few days. ° °5. Persistent numbness or new problems with movement should be communicated to the surgeon or the [facility] ([phone])/ [facility] ([phone]). °

## 2014-12-11 NOTE — Op Note (Signed)
#  [id number] 

## 2014-12-11 NOTE — Anesthesia Preprocedure Evaluation (Addendum)
Anesthesia Evaluation  Patient identified by MRN, date of birth, ID band Patient awake    Reviewed: Allergy & Precautions, NPO status , Patient's Chart, lab work & pertinent test results  Airway Mallampati: II  TM Distance: >3 FB Neck ROM: Full    Dental no notable dental hx.    Pulmonary neg pulmonary ROS,  breath sounds clear to auscultation  Pulmonary exam normal       Cardiovascular negative cardio ROS Normal cardiovascular examRhythm:Regular Rate:Normal     Neuro/Psych negative neurological ROS  negative psych ROS   GI/Hepatic negative GI ROS, Neg liver ROS,   Endo/Other  negative endocrine ROS  Renal/GU Renal disease     Musculoskeletal negative musculoskeletal ROS (+)   Abdominal   Peds  Hematology negative hematology ROS (+)   Anesthesia Other Findings   Reproductive/Obstetrics negative OB ROS                             Anesthesia Physical Anesthesia Plan  ASA: II  Anesthesia Plan: General   Post-op Pain Management:    Induction: Intravenous, Rapid sequence and Cricoid pressure planned  Airway Management Planned: Oral ETT  Additional Equipment: None  Intra-op Plan:   Post-operative Plan: Extubation in OR  Informed Consent: I have reviewed the patients History and Physical, chart, labs and discussed the procedure including the risks, benefits and alternatives for the proposed anesthesia with the patient or authorized representative who has indicated his/her understanding and acceptance.   Dental advisory given  Plan Discussed with: CRNA  Anesthesia Plan Comments: (Patient had 3 raw cherries this morning at ~9 am. I discussed the increased risk of aspiration, but feel that it is minimal. )       Anesthesia Quick Evaluation

## 2014-12-12 NOTE — Anesthesia Postprocedure Evaluation (Signed)
Anesthesia Post Note  Patient: Heidi Powers  Procedure(s) Performed: Procedure(s) (LRB): LEFT SHOULDER ARTHROSCOPY WITH DEBRIDEMENT (Left) SHOULDER ACROMIOPLASTY (Left)  Anesthesia type: General  Patient location: PACU  Post pain: Pain level controlled  Post assessment: Post-op Vital signs reviewed  Last Vitals: BP 177/69 mmHg  Pulse 70  Temp(Src) 36.4 C (Oral)  Resp 18  Ht 5\' 1"  (1.549 m)  Wt 163 lb 8 oz (74.163 kg)  BMI 30.91 kg/m2  SpO2 100%  Post vital signs: Reviewed  Level of consciousness: sedated  Complications: No apparent anesthesia complications

## 2014-12-14 NOTE — Op Note (Signed)
NAMERAMANI, RIVA NO.:  [id number]  MEDICAL RECORD NO.:  [id number]  LOCATION:                               FACILITY:  [facility]  PHYSICIAN:  Monico Blitz. Hilja Kintzel, M.D.DATE OF BIRTH:  1950-10-28  DATE OF PROCEDURE:  12/11/2014 DATE OF DISCHARGE:  12/11/2014                              OPERATIVE REPORT   PREOPERATIVE DIAGNOSES: 1. Left shoulder impingement. 2. Left shoulder biceps tear.  POSTOPERATIVE DIAGNOSES: 1. Left shoulder impingement. 2. Left shoulder biceps tear.  PROCEDURES: 1. Left shoulder arthroscopic acromioplasty. 2. Left shoulder arthroscopic debridement.  ANESTHESIA:  General and block.  ATTENDING SURGEON:  Monico Blitz. Rhona Raider, M.D.  ASSISTANT:  Loni Dolly, PA.  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  This has persisted despite an injection and oral anti-inflammatories and activity restriction.  She has pain, which limits her ability to rest and do her job.  By MRI scan, she has a complete biceps tear along with things consistent with impingement and irritation of her rotator cuff.  She is offered an arthroscopy.  Informed operative consent was obtained after discussion of possible complications including reaction to anesthesia and infection.  She is status post shoulder arthroscopy in the opposite side 9 years back, which worked out fine.  SUMMARY OF FINDINGS AND PROCEDURE:  Under general anesthesia and a block, a left shoulder arthroscopy was performed.  Glenohumeral joint showed no degenerative changes.  The biceps tendon was absent with a bit of a stump still on the joint, which I debrided.  The rotator cuff appeared benign from below.  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  In the subacromial space, she had a freely mobile os, which appeared to be causing some impingement.  This was consistent with  her preoperative film.  This was freely mobile and fairly small and elected to excise.  I shelled this out of the coracoacromial ligament and then removed it.  I performed a brief acromioplasty with the bur in the posterior portal of the residual acromion.  The rotator cuff was examined from above, but no tear worthy of repair was found.  She was discharged home the same day.  DESCRIPTION OF PROCEDURE:  The patient was taken to the operating suite where general anesthetic was applied without difficulty.  She was also given a block in the pre-anesthesia area.  She was positioned in a beach- chair position and prepped and draped in normal sterile fashion.  After the administration of brief IV vancomycin and an appropriate time-out, an arthroscopy of the left shoulder was performed through a total of 3 portals.  Findings were as noted above and procedure consisted predominantly of the acromioplasty, which was done mostly by excising the os acromiale.  I then performed a brief contouring of the residual acromion.  The cuff was examined from below and above and a thorough bursectomy was done, but no tear worthy of repair was found.  Also performed debridement of the biceps stump.  The shoulder was thoroughly irrigated followed by reapproximation of portals loosely with nylon. Adaptic was applied  followed by dry gauze and tape.  Estimated blood loss and intraoperative fluids can be obtained from anesthesia records.  DISPOSITION:  The patient was extubated in the operating room and taken to the recovery room in a stable condition.  Plans were for her to go home same-day and follow up in the office closely.  I will contact her by phone tonight.     Monico Blitz Rhona Raider, M.D.   ______________________________ Monico Blitz. Rhona Raider, M.D.    PGD/MEDQ  D:  12/11/2014  T:  12/12/2014  Job:  [id number]

## 2014-12-16 ENCOUNTER — Encounter (HOSPITAL_BASED_OUTPATIENT_CLINIC_OR_DEPARTMENT_OTHER): Payer: Self-pay | Admitting: Orthopaedic Surgery

## 2016-03-21 ENCOUNTER — Other Ambulatory Visit: Payer: Self-pay | Admitting: *Deleted

## 2016-03-21 ENCOUNTER — Ambulatory Visit (INDEPENDENT_AMBULATORY_CARE_PROVIDER_SITE_OTHER): Payer: BLUE CROSS/BLUE SHIELD | Admitting: Podiatry

## 2016-03-21 ENCOUNTER — Encounter: Payer: Self-pay | Admitting: Podiatry

## 2016-03-21 DIAGNOSIS — L603 Nail dystrophy: Secondary | ICD-10-CM

## 2016-03-21 DIAGNOSIS — L84 Corns and callosities: Secondary | ICD-10-CM | POA: Diagnosis not present

## 2016-03-21 NOTE — Progress Notes (Signed)
   Subjective:    Patient ID: Heidi Powers, female    DOB: June 20, 1950, 65 y.o.   MRN: [id number]  HPI: She presents today with concern of discolored toenails to the hallux right. She denies any injury to the hallux right but states that the discoloration is been there for quite some time saw previous doctor who trended down and smoothed that out. Did not take samples to send to the LAD. She states that he did prescribe Lamisil but she would like to have a second opinion before taking it.    Review of Systems  Musculoskeletal: Positive for arthralgias.  All other systems reviewed and are negative.      Objective:   Physical Exam: Vital signs are stable alert and oriented 3. Vital signs are stable alert and oriented 3. Pulses are strongly palpable. Neurologic sensorium is intact. Deep tendon reflexes are intact. Muscle strength is normal bilateral. Orthopedic evaluation shows all joints distal to the ankle for range of motion but crepitation. Cheney's evaluation of Mr. supple well-hydrated cutis erythema edema cellulitis drainage or odor no signs of infection of the cutis. There is discoloration of the nail plate central distal aspect of the nail plate hallux right. Cannot tell if this is actually a fungus or not. I feel at this point the best thing to do B take a sample and sent pathologic evaluation. She does have multiple reactive hyperkeratotic lesions medial aspect of the IP joints of the plantar aspect of the forefoot bilaterally. She would like to have these trimmed.        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  Plan: Sample the nails were taken today to be sent for pathologic evaluation will notify her with the results once they return. Debrided all reactive hyperkeratoses for her today.

## 2016-04-18 ENCOUNTER — Encounter: Payer: Self-pay | Admitting: Podiatry

## 2016-04-18 ENCOUNTER — Ambulatory Visit (INDEPENDENT_AMBULATORY_CARE_PROVIDER_SITE_OTHER): Payer: BLUE CROSS/BLUE SHIELD | Admitting: Podiatry

## 2016-04-18 DIAGNOSIS — L603 Nail dystrophy: Secondary | ICD-10-CM

## 2016-04-18 DIAGNOSIS — Z79899 Other long term (current) drug therapy: Secondary | ICD-10-CM

## 2016-04-18 MED ORDER — ITRACONAZOLE 100 MG PO CAPS
100.0000 mg | ORAL_CAPSULE | Freq: Two times a day (BID) | ORAL | 0 refills | Status: DC
Start: 2016-04-18 — End: 2016-05-08

## 2016-04-18 NOTE — Progress Notes (Signed)
She presents today for follow-up of a positive nail fungus culture.  Objective: Vital signs are stable she is alert and oriented 3. Fungal culture did come back positive for onychomycosis.  Assessment: Onychomycosis.  Plan: We will start her on Sporanox/interconazole. We discussed the pros and cons of this. We requested liver profile and CBC. I will follow up in 1 month.

## 2016-04-18 NOTE — Patient Instructions (Signed)

## 2016-04-25 ENCOUNTER — Telehealth: Payer: Self-pay | Admitting: *Deleted

## 2016-04-25 LAB — HEPATIC FUNCTION PANEL
ALBUMIN: 4.1 g/dL (ref 3.6–5.1)
ALT: 22 U/L (ref 6–29)
AST: 24 U/L (ref 10–35)
Alkaline Phosphatase: 70 U/L (ref 33–130)
Bilirubin, Direct: 0.1 mg/dL (ref <=0.2)
Indirect Bilirubin: 0.3 mg/dL (ref 0.2–1.2)
Total Bilirubin: 0.4 mg/dL (ref 0.2–1.2)
Total Protein: 6.5 g/dL (ref 6.1–8.1)

## 2016-04-25 NOTE — Telephone Encounter (Addendum)
-----   Message from Garrel Ridgel, [place] sent at 04/25/2016  7:08 AM EST ----- Blood work looks good. Informed pt of Dr. Stephenie Acres review of results. 05/01/2016-Pt states her insurance won't cover the Sporanox. 05/02/2016-Pt states she doesn't want to take the Sporanox and would like to take the Lamisil.05/08/2016-Informed pt of Dr. Stephenie Acres orders and transferred to schedulers.

## 2016-05-08 MED ORDER — TERBINAFINE HCL 250 MG PO TABS: 250.0000 mg | ORAL_TABLET | Freq: Every day | ORAL | 0 refills | Status: AC

## 2016-05-08 NOTE — Telephone Encounter (Signed)
Ok and follow up in a month.  May not be as effective as sporanox on her type of infection.

## 2016-05-23 ENCOUNTER — Ambulatory Visit: Payer: BLUE CROSS/BLUE SHIELD | Admitting: Podiatry

## 2016-05-29 ENCOUNTER — Other Ambulatory Visit: Payer: Self-pay

## 2016-05-29 DIAGNOSIS — M79622 Pain in left upper arm: Secondary | ICD-10-CM

## 2016-06-06 ENCOUNTER — Ambulatory Visit
Admission: RE | Admit: 2016-06-06 | Discharge: 2016-06-06 | Disposition: A | Discharge status: Home / Self Care | Payer: 59 | Source: Ambulatory Visit

## 2016-06-06 ENCOUNTER — Other Ambulatory Visit: Payer: Self-pay

## 2016-06-06 DIAGNOSIS — N6310 Unspecified lump in the right breast, unspecified quadrant: Secondary | ICD-10-CM

## 2016-06-06 DIAGNOSIS — M79622 Pain in left upper arm: Secondary | ICD-10-CM

## 2016-06-13 ENCOUNTER — Encounter (INDEPENDENT_AMBULATORY_CARE_PROVIDER_SITE_OTHER): Payer: Self-pay | Admitting: Podiatry

## 2016-06-13 NOTE — Progress Notes (Signed)
This encounter was created in error - please disregard.

## 2016-07-07 DIAGNOSIS — M1811 Unilateral primary osteoarthritis of first carpometacarpal joint, right hand: Secondary | ICD-10-CM | POA: Diagnosis not present

## 2016-07-07 DIAGNOSIS — G8918 Other acute postprocedural pain: Secondary | ICD-10-CM | POA: Diagnosis not present

## 2016-07-07 DIAGNOSIS — M19031 Primary osteoarthritis, right wrist: Secondary | ICD-10-CM | POA: Diagnosis not present

## 2016-07-19 DIAGNOSIS — M79641 Pain in right hand: Secondary | ICD-10-CM | POA: Diagnosis not present

## 2016-07-19 DIAGNOSIS — M19041 Primary osteoarthritis, right hand: Secondary | ICD-10-CM | POA: Diagnosis not present

## 2016-07-19 DIAGNOSIS — M19031 Primary osteoarthritis, right wrist: Secondary | ICD-10-CM | POA: Diagnosis not present

## 2016-07-25 DIAGNOSIS — M79641 Pain in right hand: Secondary | ICD-10-CM | POA: Diagnosis not present

## 2016-07-25 DIAGNOSIS — M19041 Primary osteoarthritis, right hand: Secondary | ICD-10-CM | POA: Diagnosis not present

## 2016-08-09 DIAGNOSIS — M79641 Pain in right hand: Secondary | ICD-10-CM | POA: Diagnosis not present

## 2016-08-09 DIAGNOSIS — M19041 Primary osteoarthritis, right hand: Secondary | ICD-10-CM | POA: Diagnosis not present

## 2016-08-16 DIAGNOSIS — M19041 Primary osteoarthritis, right hand: Secondary | ICD-10-CM | POA: Diagnosis not present

## 2016-08-16 DIAGNOSIS — M19031 Primary osteoarthritis, right wrist: Secondary | ICD-10-CM | POA: Diagnosis not present

## 2016-08-16 DIAGNOSIS — M79641 Pain in right hand: Secondary | ICD-10-CM | POA: Diagnosis not present

## 2016-09-01 DIAGNOSIS — M79641 Pain in right hand: Secondary | ICD-10-CM | POA: Diagnosis not present

## 2016-09-01 DIAGNOSIS — M19041 Primary osteoarthritis, right hand: Secondary | ICD-10-CM | POA: Diagnosis not present

## 2016-09-05 DIAGNOSIS — M79641 Pain in right hand: Secondary | ICD-10-CM | POA: Diagnosis not present

## 2016-09-05 DIAGNOSIS — M19041 Primary osteoarthritis, right hand: Secondary | ICD-10-CM | POA: Diagnosis not present

## 2016-09-13 DIAGNOSIS — M19031 Primary osteoarthritis, right wrist: Secondary | ICD-10-CM | POA: Diagnosis not present

## 2016-09-25 DIAGNOSIS — M19041 Primary osteoarthritis, right hand: Secondary | ICD-10-CM | POA: Diagnosis not present

## 2016-09-25 DIAGNOSIS — M79641 Pain in right hand: Secondary | ICD-10-CM | POA: Diagnosis not present

## 2016-10-16 DIAGNOSIS — M19041 Primary osteoarthritis, right hand: Secondary | ICD-10-CM | POA: Diagnosis not present

## 2016-10-16 DIAGNOSIS — M79641 Pain in right hand: Secondary | ICD-10-CM | POA: Diagnosis not present

## 2016-10-24 DIAGNOSIS — M19031 Primary osteoarthritis, right wrist: Secondary | ICD-10-CM | POA: Diagnosis not present

## 2016-10-26 DIAGNOSIS — M79641 Pain in right hand: Secondary | ICD-10-CM | POA: Diagnosis not present

## 2016-10-26 DIAGNOSIS — M19041 Primary osteoarthritis, right hand: Secondary | ICD-10-CM | POA: Diagnosis not present

## 2016-11-03 DIAGNOSIS — M19041 Primary osteoarthritis, right hand: Secondary | ICD-10-CM | POA: Diagnosis not present

## 2016-11-03 DIAGNOSIS — M79641 Pain in right hand: Secondary | ICD-10-CM | POA: Diagnosis not present

## 2016-11-13 DIAGNOSIS — M19041 Primary osteoarthritis, right hand: Secondary | ICD-10-CM | POA: Diagnosis not present

## 2016-11-13 DIAGNOSIS — M79641 Pain in right hand: Secondary | ICD-10-CM | POA: Diagnosis not present

## 2016-12-06 DIAGNOSIS — E669 Obesity, unspecified: Secondary | ICD-10-CM | POA: Diagnosis not present

## 2016-12-06 DIAGNOSIS — Z6831 Body mass index (BMI) 31.0-31.9, adult: Secondary | ICD-10-CM | POA: Diagnosis not present

## 2016-12-06 DIAGNOSIS — M545 Low back pain: Secondary | ICD-10-CM | POA: Diagnosis not present

## 2016-12-06 DIAGNOSIS — R5383 Other fatigue: Secondary | ICD-10-CM | POA: Diagnosis not present

## 2017-02-08 DIAGNOSIS — M79605 Pain in left leg: Secondary | ICD-10-CM | POA: Diagnosis not present

## 2017-06-08 DIAGNOSIS — Z1389 Encounter for screening for other disorder: Secondary | ICD-10-CM | POA: Diagnosis not present

## 2017-06-08 DIAGNOSIS — Z0000 Encounter for general adult medical examination without abnormal findings: Secondary | ICD-10-CM | POA: Diagnosis not present

## 2017-06-08 DIAGNOSIS — E7800 Pure hypercholesterolemia, unspecified: Secondary | ICD-10-CM | POA: Diagnosis not present

## 2017-06-08 DIAGNOSIS — R7301 Impaired fasting glucose: Secondary | ICD-10-CM | POA: Diagnosis not present

## 2017-06-08 DIAGNOSIS — E663 Overweight: Secondary | ICD-10-CM | POA: Diagnosis not present

## 2017-07-06 DIAGNOSIS — J069 Acute upper respiratory infection, unspecified: Secondary | ICD-10-CM | POA: Diagnosis not present

## 2017-09-11 DIAGNOSIS — M545 Low back pain: Secondary | ICD-10-CM | POA: Diagnosis not present

## 2017-10-03 DIAGNOSIS — L300 Nummular dermatitis: Secondary | ICD-10-CM | POA: Diagnosis not present

## 2017-10-10 DIAGNOSIS — M545 Low back pain: Secondary | ICD-10-CM | POA: Diagnosis not present

## 2018-04-01 DIAGNOSIS — R252 Cramp and spasm: Secondary | ICD-10-CM | POA: Diagnosis not present

## 2018-04-01 DIAGNOSIS — Z114 Encounter for screening for human immunodeficiency virus [HIV]: Secondary | ICD-10-CM | POA: Diagnosis not present

## 2018-04-01 DIAGNOSIS — R0681 Apnea, not elsewhere classified: Secondary | ICD-10-CM | POA: Diagnosis not present

## 2018-06-14 DIAGNOSIS — Z0100 Encounter for examination of eyes and vision without abnormal findings: Secondary | ICD-10-CM | POA: Diagnosis not present

## 2018-06-14 DIAGNOSIS — H524 Presbyopia: Secondary | ICD-10-CM | POA: Diagnosis not present

## 2018-06-21 DIAGNOSIS — G4733 Obstructive sleep apnea (adult) (pediatric): Secondary | ICD-10-CM | POA: Diagnosis not present

## 2018-06-24 DIAGNOSIS — G4733 Obstructive sleep apnea (adult) (pediatric): Secondary | ICD-10-CM | POA: Diagnosis not present

## 2018-07-05 ENCOUNTER — Other Ambulatory Visit: Payer: Self-pay | Admitting: Internal Medicine

## 2018-07-05 DIAGNOSIS — E663 Overweight: Secondary | ICD-10-CM | POA: Diagnosis not present

## 2018-07-05 DIAGNOSIS — E2839 Other primary ovarian failure: Secondary | ICD-10-CM | POA: Diagnosis not present

## 2018-07-05 DIAGNOSIS — R7301 Impaired fasting glucose: Secondary | ICD-10-CM | POA: Diagnosis not present

## 2018-07-05 DIAGNOSIS — E7800 Pure hypercholesterolemia, unspecified: Secondary | ICD-10-CM | POA: Diagnosis not present

## 2018-07-05 DIAGNOSIS — R030 Elevated blood-pressure reading, without diagnosis of hypertension: Secondary | ICD-10-CM | POA: Diagnosis not present

## 2018-07-05 DIAGNOSIS — Z0000 Encounter for general adult medical examination without abnormal findings: Secondary | ICD-10-CM | POA: Diagnosis not present

## 2018-07-05 DIAGNOSIS — N6310 Unspecified lump in the right breast, unspecified quadrant: Secondary | ICD-10-CM

## 2018-07-05 DIAGNOSIS — Z1389 Encounter for screening for other disorder: Secondary | ICD-10-CM | POA: Diagnosis not present

## 2018-07-07 DIAGNOSIS — Z79899 Other long term (current) drug therapy: Secondary | ICD-10-CM | POA: Diagnosis not present

## 2018-07-07 DIAGNOSIS — I10 Essential (primary) hypertension: Secondary | ICD-10-CM | POA: Diagnosis not present

## 2018-07-08 DIAGNOSIS — R030 Elevated blood-pressure reading, without diagnosis of hypertension: Secondary | ICD-10-CM | POA: Diagnosis not present

## 2018-07-09 ENCOUNTER — Other Ambulatory Visit: Payer: Self-pay | Admitting: Internal Medicine

## 2018-07-09 DIAGNOSIS — E2839 Other primary ovarian failure: Secondary | ICD-10-CM

## 2018-07-22 ENCOUNTER — Ambulatory Visit
Admission: RE | Admit: 2018-07-22 | Discharge: 2018-07-22 | Disposition: A | Discharge status: Home / Self Care | Payer: 59 | Source: Ambulatory Visit | Attending: Internal Medicine | Admitting: Internal Medicine

## 2018-07-22 DIAGNOSIS — N6310 Unspecified lump in the right breast, unspecified quadrant: Secondary | ICD-10-CM

## 2018-07-22 DIAGNOSIS — R030 Elevated blood-pressure reading, without diagnosis of hypertension: Secondary | ICD-10-CM | POA: Diagnosis not present

## 2018-07-22 DIAGNOSIS — R928 Other abnormal and inconclusive findings on diagnostic imaging of breast: Secondary | ICD-10-CM | POA: Diagnosis not present

## 2018-07-22 DIAGNOSIS — G4733 Obstructive sleep apnea (adult) (pediatric): Secondary | ICD-10-CM | POA: Diagnosis not present

## 2018-08-05 ENCOUNTER — Other Ambulatory Visit: Payer: 59

## 2018-08-13 DIAGNOSIS — I10 Essential (primary) hypertension: Secondary | ICD-10-CM | POA: Diagnosis not present

## 2018-08-20 DIAGNOSIS — G4733 Obstructive sleep apnea (adult) (pediatric): Secondary | ICD-10-CM | POA: Diagnosis not present

## 2018-08-29 DIAGNOSIS — G4733 Obstructive sleep apnea (adult) (pediatric): Secondary | ICD-10-CM | POA: Diagnosis not present

## 2018-09-20 DIAGNOSIS — G4733 Obstructive sleep apnea (adult) (pediatric): Secondary | ICD-10-CM | POA: Diagnosis not present

## 2018-09-23 DIAGNOSIS — G4733 Obstructive sleep apnea (adult) (pediatric): Secondary | ICD-10-CM | POA: Diagnosis not present

## 2018-10-07 ENCOUNTER — Other Ambulatory Visit: Payer: 59

## 2018-10-21 DIAGNOSIS — M79605 Pain in left leg: Secondary | ICD-10-CM | POA: Diagnosis not present

## 2018-10-22 DIAGNOSIS — G4733 Obstructive sleep apnea (adult) (pediatric): Secondary | ICD-10-CM | POA: Diagnosis not present

## 2018-11-08 ENCOUNTER — Other Ambulatory Visit: Payer: 59

## 2018-11-18 ENCOUNTER — Other Ambulatory Visit: Payer: Self-pay | Admitting: Internal Medicine

## 2018-11-18 ENCOUNTER — Ambulatory Visit
Admission: RE | Admit: 2018-11-18 | Discharge: 2018-11-18 | Disposition: A | Discharge status: Home / Self Care | Payer: 59 | Source: Ambulatory Visit | Attending: Internal Medicine | Admitting: Internal Medicine

## 2018-11-18 DIAGNOSIS — R1032 Left lower quadrant pain: Secondary | ICD-10-CM

## 2018-12-30 ENCOUNTER — Other Ambulatory Visit: Payer: Self-pay

## 2018-12-30 ENCOUNTER — Ambulatory Visit
Admission: RE | Admit: 2018-12-30 | Discharge: 2018-12-30 | Disposition: A | Discharge status: Home / Self Care | Payer: 59 | Source: Ambulatory Visit | Attending: Internal Medicine | Admitting: Internal Medicine

## 2018-12-30 DIAGNOSIS — E2839 Other primary ovarian failure: Secondary | ICD-10-CM

## 2019-04-21 ENCOUNTER — Other Ambulatory Visit: Payer: Self-pay

## 2019-04-21 DIAGNOSIS — Z20822 Contact with and (suspected) exposure to covid-19: Secondary | ICD-10-CM

## 2019-04-24 LAB — NOVEL CORONAVIRUS, NAA: SARS-CoV-2, NAA: NOT DETECTED

## 2019-07-01 DIAGNOSIS — G4733 Obstructive sleep apnea (adult) (pediatric): Secondary | ICD-10-CM | POA: Diagnosis not present

## 2019-07-01 DIAGNOSIS — Z0000 Encounter for general adult medical examination without abnormal findings: Secondary | ICD-10-CM | POA: Diagnosis not present

## 2019-07-01 DIAGNOSIS — Z1211 Encounter for screening for malignant neoplasm of colon: Secondary | ICD-10-CM | POA: Diagnosis not present

## 2019-07-01 DIAGNOSIS — R7309 Other abnormal glucose: Secondary | ICD-10-CM | POA: Diagnosis not present

## 2019-07-01 DIAGNOSIS — Z1389 Encounter for screening for other disorder: Secondary | ICD-10-CM | POA: Diagnosis not present

## 2019-07-01 DIAGNOSIS — M519 Unspecified thoracic, thoracolumbar and lumbosacral intervertebral disc disorder: Secondary | ICD-10-CM | POA: Diagnosis not present

## 2019-07-01 DIAGNOSIS — R232 Flushing: Secondary | ICD-10-CM | POA: Diagnosis not present

## 2019-07-01 DIAGNOSIS — Z1239 Encounter for other screening for malignant neoplasm of breast: Secondary | ICD-10-CM | POA: Diagnosis not present

## 2019-07-01 DIAGNOSIS — I10 Essential (primary) hypertension: Secondary | ICD-10-CM | POA: Diagnosis not present

## 2019-08-28 ENCOUNTER — Other Ambulatory Visit: Payer: Self-pay | Admitting: Internal Medicine

## 2019-08-28 DIAGNOSIS — Z1231 Encounter for screening mammogram for malignant neoplasm of breast: Secondary | ICD-10-CM

## 2019-09-16 ENCOUNTER — Ambulatory Visit: Payer: Medicare Other

## 2019-10-13 DIAGNOSIS — M79606 Pain in leg, unspecified: Secondary | ICD-10-CM | POA: Diagnosis not present

## 2019-10-16 DIAGNOSIS — Z1159 Encounter for screening for other viral diseases: Secondary | ICD-10-CM | POA: Diagnosis not present

## 2019-10-21 DIAGNOSIS — Z1211 Encounter for screening for malignant neoplasm of colon: Secondary | ICD-10-CM | POA: Diagnosis not present

## 2019-10-21 DIAGNOSIS — D122 Benign neoplasm of ascending colon: Secondary | ICD-10-CM | POA: Diagnosis not present

## 2019-10-21 DIAGNOSIS — K640 First degree hemorrhoids: Secondary | ICD-10-CM | POA: Diagnosis not present

## 2019-10-21 DIAGNOSIS — K5730 Diverticulosis of large intestine without perforation or abscess without bleeding: Secondary | ICD-10-CM | POA: Diagnosis not present

## 2019-10-23 DIAGNOSIS — D122 Benign neoplasm of ascending colon: Secondary | ICD-10-CM | POA: Diagnosis not present

## 2019-10-28 ENCOUNTER — Ambulatory Visit: Payer: Medicare Other

## 2019-11-24 ENCOUNTER — Ambulatory Visit
Admission: RE | Admit: 2019-11-24 | Discharge: 2019-11-24 | Disposition: A | Discharge status: Home / Self Care | Payer: Medicare PPO | Source: Ambulatory Visit | Attending: Internal Medicine | Admitting: Internal Medicine

## 2019-11-24 ENCOUNTER — Other Ambulatory Visit: Payer: Self-pay | Admitting: Internal Medicine

## 2019-11-24 DIAGNOSIS — M79604 Pain in right leg: Secondary | ICD-10-CM | POA: Diagnosis not present

## 2019-11-24 DIAGNOSIS — M79605 Pain in left leg: Secondary | ICD-10-CM

## 2019-11-24 DIAGNOSIS — M47816 Spondylosis without myelopathy or radiculopathy, lumbar region: Secondary | ICD-10-CM | POA: Diagnosis not present

## 2019-11-24 DIAGNOSIS — M1611 Unilateral primary osteoarthritis, right hip: Secondary | ICD-10-CM | POA: Diagnosis not present

## 2020-01-01 DIAGNOSIS — R7309 Other abnormal glucose: Secondary | ICD-10-CM | POA: Diagnosis not present

## 2020-01-01 DIAGNOSIS — R232 Flushing: Secondary | ICD-10-CM | POA: Diagnosis not present

## 2020-01-01 DIAGNOSIS — I10 Essential (primary) hypertension: Secondary | ICD-10-CM | POA: Diagnosis not present

## 2020-01-01 DIAGNOSIS — M519 Unspecified thoracic, thoracolumbar and lumbosacral intervertebral disc disorder: Secondary | ICD-10-CM | POA: Diagnosis not present

## 2020-02-27 IMAGING — MG DIGITAL DIAGNOSTIC BILATERAL MAMMOGRAM WITH TOMO AND CAD
8 series · 8 of 24 positions shown · non-contrast
Comparison: Previous exam(s).

CLINICAL DATA: 67-year-old female presenting for annual bilateral
mammogram and delayed follow-up of a probably benign right breast
mass from 1254.

EXAM:
DIGITAL DIAGNOSTIC BILATERAL MAMMOGRAM WITH CAD AND TOMO

[L CC synth-2D]
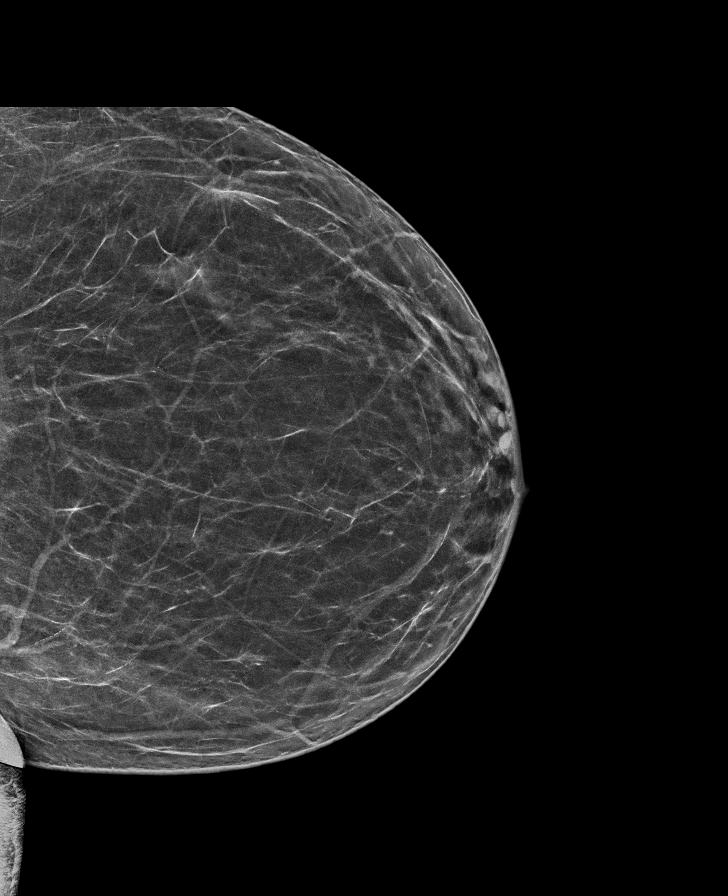

[R MLO synth-2D]
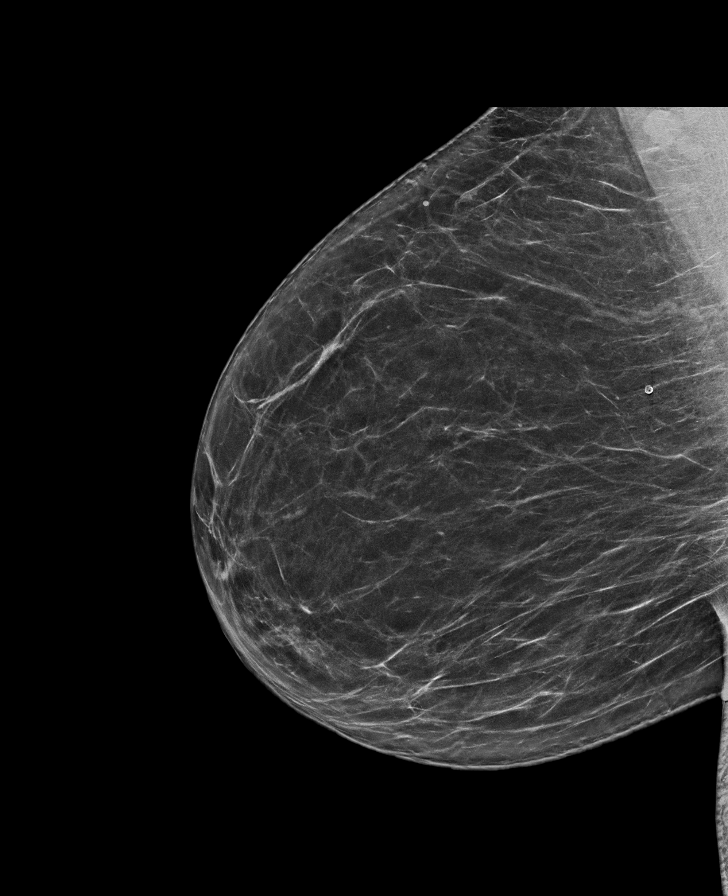

[L MLO synth-2D]
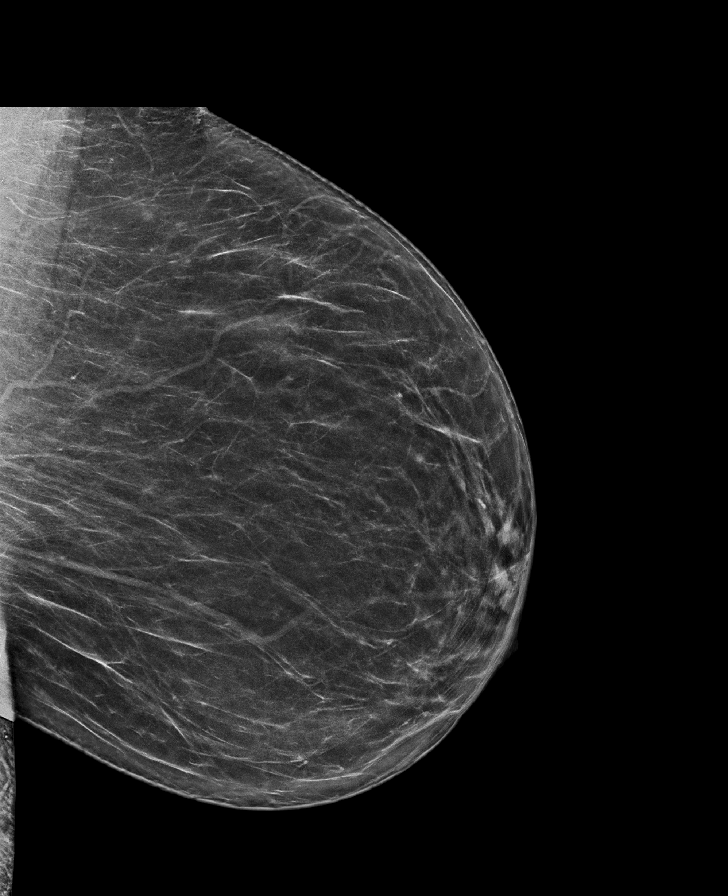

[R CC synth-2D]
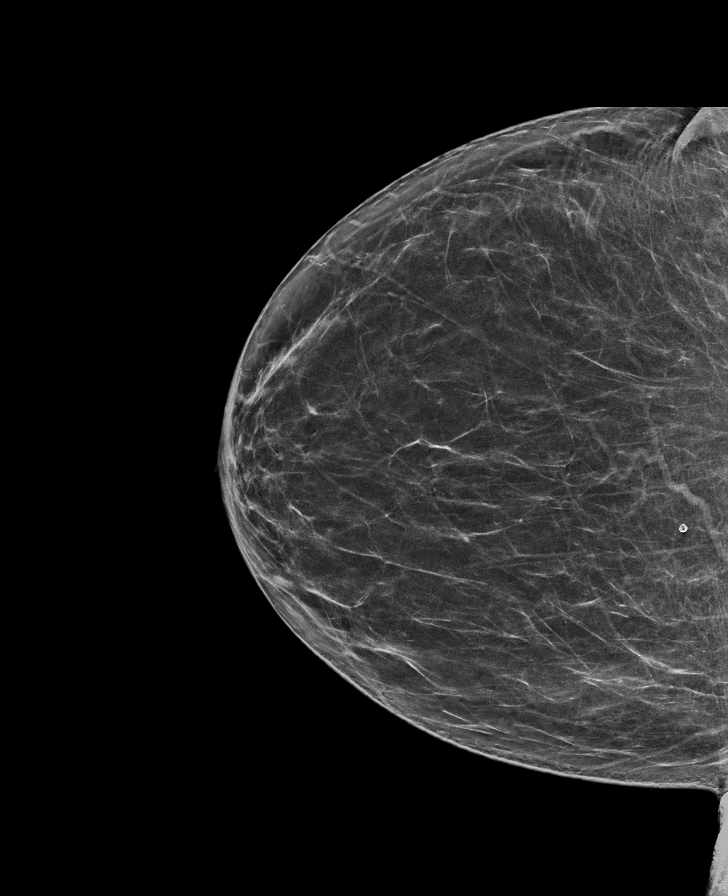

[R MLO tomo · tomo slice 39/78.0]
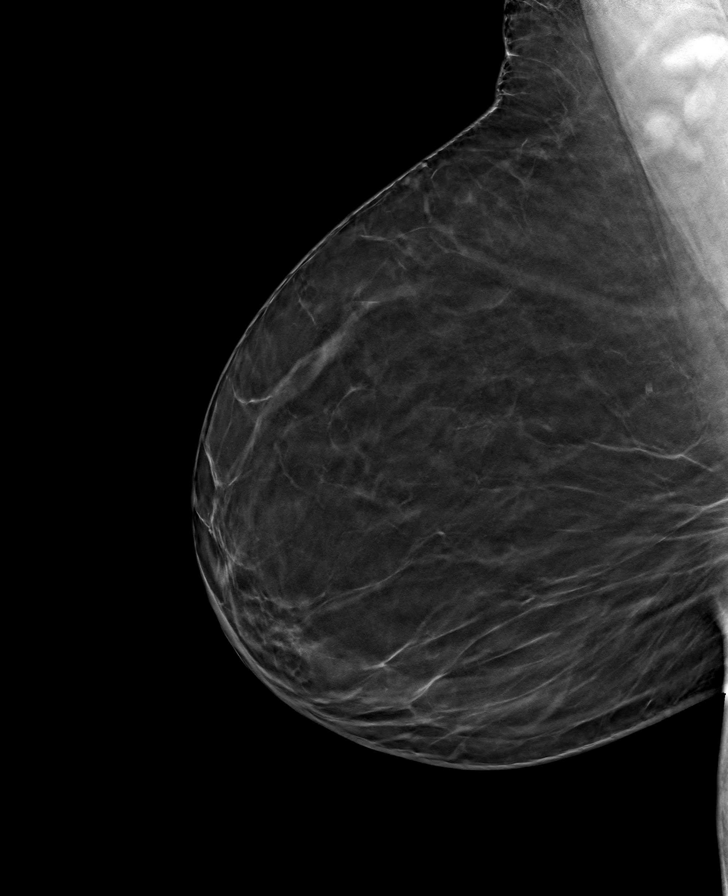

[L MLO tomo · tomo slice 39/77.0]
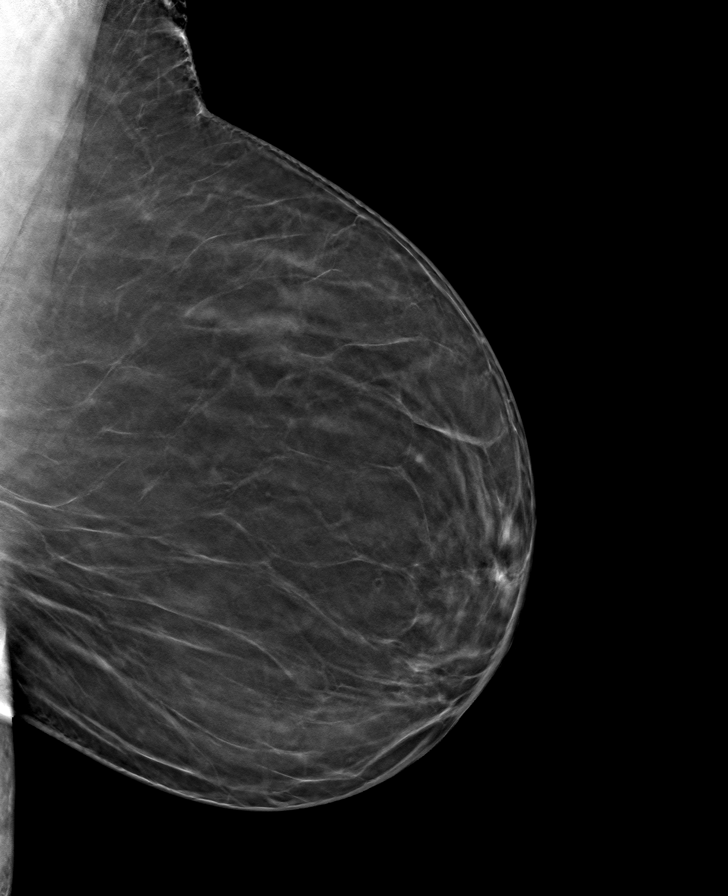

[L CC tomo · tomo slice 35/69.0]
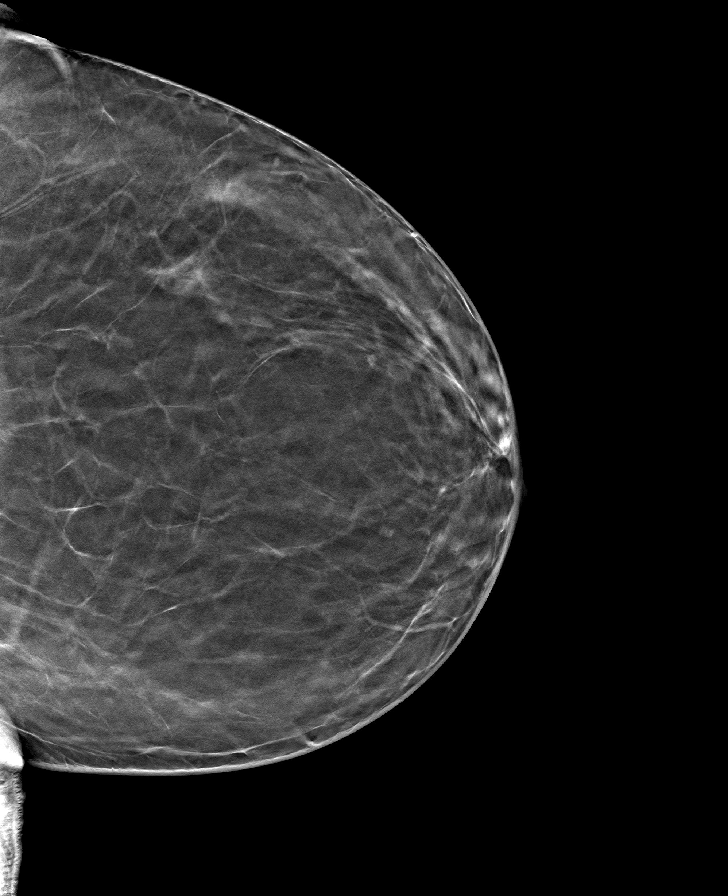

[R CC tomo · tomo slice 36/71.0]
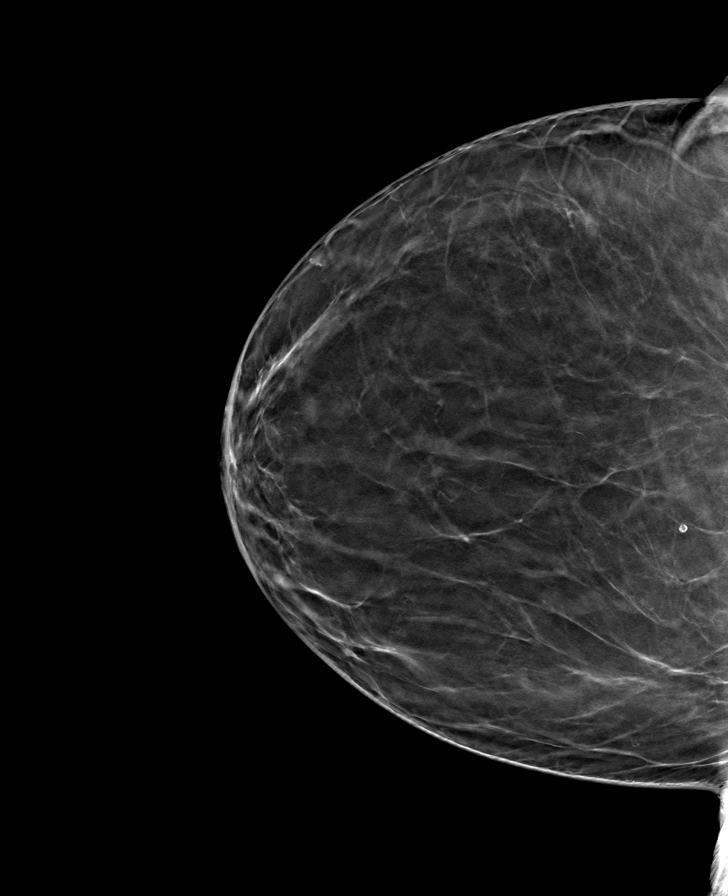

[8 of 24 positions shown; findings below may reference images not displayed]

ACR Breast Density Category b: There are scattered areas of
fibroglandular density.
FINDINGS: No suspicious mammographic findings are identified in either breast.
The previously described mass in the lower outer aspect anteriorly
has resolved in the interim.

Mammographic images were processed with CAD.
IMPRESSION: No mammographic evidence of malignancy.

RECOMMENDATION:
Screening mammogram in one year.(Code:25-6-N26)

I have discussed the findings and recommendations with the patient.
Results were also provided in writing at the conclusion of the
visit. If applicable, a reminder letter will be sent to the patient
regarding the next appointment.

BI-RADS CATEGORY  1: Negative.

## 2020-04-07 DIAGNOSIS — N390 Urinary tract infection, site not specified: Secondary | ICD-10-CM | POA: Diagnosis not present

## 2020-09-14 DIAGNOSIS — R232 Flushing: Secondary | ICD-10-CM | POA: Diagnosis not present

## 2020-09-14 DIAGNOSIS — Z1389 Encounter for screening for other disorder: Secondary | ICD-10-CM | POA: Diagnosis not present

## 2020-09-14 DIAGNOSIS — Z0000 Encounter for general adult medical examination without abnormal findings: Secondary | ICD-10-CM | POA: Diagnosis not present

## 2020-09-14 DIAGNOSIS — M519 Unspecified thoracic, thoracolumbar and lumbosacral intervertebral disc disorder: Secondary | ICD-10-CM | POA: Diagnosis not present

## 2020-09-14 DIAGNOSIS — G4733 Obstructive sleep apnea (adult) (pediatric): Secondary | ICD-10-CM | POA: Diagnosis not present

## 2020-09-14 DIAGNOSIS — I10 Essential (primary) hypertension: Secondary | ICD-10-CM | POA: Diagnosis not present

## 2020-09-14 DIAGNOSIS — Z1239 Encounter for other screening for malignant neoplasm of breast: Secondary | ICD-10-CM | POA: Diagnosis not present

## 2020-09-14 DIAGNOSIS — E7800 Pure hypercholesterolemia, unspecified: Secondary | ICD-10-CM | POA: Diagnosis not present

## 2020-09-14 DIAGNOSIS — R7303 Prediabetes: Secondary | ICD-10-CM | POA: Diagnosis not present

## 2020-10-27 DIAGNOSIS — M5441 Lumbago with sciatica, right side: Secondary | ICD-10-CM | POA: Diagnosis not present

## 2020-10-27 DIAGNOSIS — I10 Essential (primary) hypertension: Secondary | ICD-10-CM | POA: Diagnosis not present

## 2021-06-08 DIAGNOSIS — D485 Neoplasm of uncertain behavior of skin: Secondary | ICD-10-CM | POA: Diagnosis not present

## 2021-06-08 DIAGNOSIS — D2271 Melanocytic nevi of right lower limb, including hip: Secondary | ICD-10-CM | POA: Diagnosis not present

## 2021-06-08 DIAGNOSIS — L2089 Other atopic dermatitis: Secondary | ICD-10-CM | POA: Diagnosis not present

## 2021-10-24 DIAGNOSIS — R7303 Prediabetes: Secondary | ICD-10-CM | POA: Diagnosis not present

## 2021-10-24 DIAGNOSIS — E7800 Pure hypercholesterolemia, unspecified: Secondary | ICD-10-CM | POA: Diagnosis not present

## 2021-10-24 DIAGNOSIS — I10 Essential (primary) hypertension: Secondary | ICD-10-CM | POA: Diagnosis not present

## 2021-10-24 DIAGNOSIS — Z0000 Encounter for general adult medical examination without abnormal findings: Secondary | ICD-10-CM | POA: Diagnosis not present

## 2021-10-24 DIAGNOSIS — G4733 Obstructive sleep apnea (adult) (pediatric): Secondary | ICD-10-CM | POA: Diagnosis not present

## 2021-10-24 DIAGNOSIS — R232 Flushing: Secondary | ICD-10-CM | POA: Diagnosis not present

## 2021-10-24 DIAGNOSIS — Z1239 Encounter for other screening for malignant neoplasm of breast: Secondary | ICD-10-CM | POA: Diagnosis not present

## 2021-11-01 ENCOUNTER — Other Ambulatory Visit: Payer: Self-pay | Admitting: Internal Medicine

## 2021-11-01 DIAGNOSIS — Z1231 Encounter for screening mammogram for malignant neoplasm of breast: Secondary | ICD-10-CM

## 2021-11-14 ENCOUNTER — Ambulatory Visit: Payer: Medicare PPO

## 2021-11-21 ENCOUNTER — Ambulatory Visit: Payer: Medicare PPO

## 2022-01-17 ENCOUNTER — Ambulatory Visit
Admission: RE | Admit: 2022-01-17 | Discharge: 2022-01-17 | Disposition: A | Discharge status: Home / Self Care | Payer: Medicare Other | Source: Ambulatory Visit | Attending: Internal Medicine | Admitting: Internal Medicine

## 2022-01-17 DIAGNOSIS — Z1231 Encounter for screening mammogram for malignant neoplasm of breast: Secondary | ICD-10-CM

## 2022-04-11 ENCOUNTER — Other Ambulatory Visit: Payer: Self-pay

## 2022-04-11 DIAGNOSIS — R600 Localized edema: Secondary | ICD-10-CM | POA: Diagnosis not present

## 2022-04-11 MED ORDER — FLUAD QUADRIVALENT 0.5 ML IM PRSY
0.5000 mL | PREFILLED_SYRINGE | Freq: Once | INTRAMUSCULAR | 0 refills | Status: AC
  Filled 2022-04-11: qty 0.5, 1d supply, fill #0

## 2022-04-27 ENCOUNTER — Other Ambulatory Visit: Payer: Self-pay

## 2022-06-13 DIAGNOSIS — J069 Acute upper respiratory infection, unspecified: Secondary | ICD-10-CM | POA: Diagnosis not present

## 2022-07-12 DIAGNOSIS — J40 Bronchitis, not specified as acute or chronic: Secondary | ICD-10-CM | POA: Diagnosis not present

## 2022-07-12 DIAGNOSIS — R7303 Prediabetes: Secondary | ICD-10-CM | POA: Diagnosis not present

## 2022-07-12 DIAGNOSIS — G4733 Obstructive sleep apnea (adult) (pediatric): Secondary | ICD-10-CM | POA: Diagnosis not present

## 2022-07-12 DIAGNOSIS — I10 Essential (primary) hypertension: Secondary | ICD-10-CM | POA: Diagnosis not present

## 2022-10-26 DIAGNOSIS — I10 Essential (primary) hypertension: Secondary | ICD-10-CM | POA: Diagnosis not present

## 2022-10-26 DIAGNOSIS — G4733 Obstructive sleep apnea (adult) (pediatric): Secondary | ICD-10-CM | POA: Diagnosis not present

## 2022-10-26 DIAGNOSIS — R059 Cough, unspecified: Secondary | ICD-10-CM | POA: Diagnosis not present

## 2022-10-26 DIAGNOSIS — E7800 Pure hypercholesterolemia, unspecified: Secondary | ICD-10-CM | POA: Diagnosis not present

## 2022-10-26 DIAGNOSIS — R7303 Prediabetes: Secondary | ICD-10-CM | POA: Diagnosis not present

## 2022-10-26 DIAGNOSIS — Z23 Encounter for immunization: Secondary | ICD-10-CM | POA: Diagnosis not present

## 2022-10-26 DIAGNOSIS — Z0000 Encounter for general adult medical examination without abnormal findings: Secondary | ICD-10-CM | POA: Diagnosis not present

## 2022-10-26 DIAGNOSIS — Z1389 Encounter for screening for other disorder: Secondary | ICD-10-CM | POA: Diagnosis not present

## 2022-10-26 DIAGNOSIS — M519 Unspecified thoracic, thoracolumbar and lumbosacral intervertebral disc disorder: Secondary | ICD-10-CM | POA: Diagnosis not present

## 2022-10-26 DIAGNOSIS — R232 Flushing: Secondary | ICD-10-CM | POA: Diagnosis not present

## 2022-12-20 DIAGNOSIS — L720 Epidermal cyst: Secondary | ICD-10-CM | POA: Diagnosis not present

## 2022-12-20 DIAGNOSIS — L820 Inflamed seborrheic keratosis: Secondary | ICD-10-CM | POA: Diagnosis not present

## 2023-03-05 ENCOUNTER — Other Ambulatory Visit: Payer: Self-pay | Admitting: Internal Medicine

## 2023-03-05 DIAGNOSIS — Z1231 Encounter for screening mammogram for malignant neoplasm of breast: Secondary | ICD-10-CM

## 2023-03-23 ENCOUNTER — Ambulatory Visit
Admission: RE | Admit: 2023-03-23 | Discharge: 2023-03-23 | Disposition: A | Discharge status: Home / Self Care | Payer: Medicare Other | Source: Ambulatory Visit | Attending: Internal Medicine | Admitting: Internal Medicine

## 2023-03-23 DIAGNOSIS — Z1231 Encounter for screening mammogram for malignant neoplasm of breast: Secondary | ICD-10-CM

## 2023-04-30 DIAGNOSIS — E119 Type 2 diabetes mellitus without complications: Secondary | ICD-10-CM | POA: Diagnosis not present

## 2023-04-30 DIAGNOSIS — I10 Essential (primary) hypertension: Secondary | ICD-10-CM | POA: Diagnosis not present

## 2023-04-30 DIAGNOSIS — E1159 Type 2 diabetes mellitus with other circulatory complications: Secondary | ICD-10-CM | POA: Diagnosis not present

## 2023-04-30 DIAGNOSIS — G4733 Obstructive sleep apnea (adult) (pediatric): Secondary | ICD-10-CM | POA: Diagnosis not present

## 2023-04-30 DIAGNOSIS — G2581 Restless legs syndrome: Secondary | ICD-10-CM | POA: Diagnosis not present

## 2023-07-18 ENCOUNTER — Ambulatory Visit: Payer: Medicare Other | Admitting: Podiatry

## 2023-09-12 DIAGNOSIS — R4589 Other symptoms and signs involving emotional state: Secondary | ICD-10-CM | POA: Diagnosis not present

## 2023-09-12 DIAGNOSIS — R42 Dizziness and giddiness: Secondary | ICD-10-CM | POA: Diagnosis not present

## 2023-09-12 DIAGNOSIS — M545 Low back pain, unspecified: Secondary | ICD-10-CM | POA: Diagnosis not present

## 2023-11-13 ENCOUNTER — Ambulatory Visit: Admitting: Podiatry

## 2023-11-20 ENCOUNTER — Ambulatory Visit: Admitting: Podiatry

## 2023-11-27 ENCOUNTER — Ambulatory Visit: Admitting: Podiatry

## 2023-11-27 ENCOUNTER — Encounter: Payer: Self-pay | Admitting: Podiatry

## 2023-11-27 DIAGNOSIS — R7301 Impaired fasting glucose: Secondary | ICD-10-CM | POA: Insufficient documentation

## 2023-11-27 DIAGNOSIS — D2371 Other benign neoplasm of skin of right lower limb, including hip: Secondary | ICD-10-CM | POA: Diagnosis not present

## 2023-11-27 DIAGNOSIS — M7752 Other enthesopathy of left foot: Secondary | ICD-10-CM

## 2023-11-27 DIAGNOSIS — D2372 Other benign neoplasm of skin of left lower limb, including hip: Secondary | ICD-10-CM | POA: Diagnosis not present

## 2023-11-27 DIAGNOSIS — E663 Overweight: Secondary | ICD-10-CM | POA: Insufficient documentation

## 2023-11-27 DIAGNOSIS — I10 Essential (primary) hypertension: Secondary | ICD-10-CM | POA: Insufficient documentation

## 2023-11-27 DIAGNOSIS — E7800 Pure hypercholesterolemia, unspecified: Secondary | ICD-10-CM | POA: Insufficient documentation

## 2023-11-27 DIAGNOSIS — M7750 Other enthesopathy of unspecified foot: Secondary | ICD-10-CM

## 2023-11-27 DIAGNOSIS — R928 Other abnormal and inconclusive findings on diagnostic imaging of breast: Secondary | ICD-10-CM | POA: Insufficient documentation

## 2023-11-27 DIAGNOSIS — E2839 Other primary ovarian failure: Secondary | ICD-10-CM | POA: Insufficient documentation

## 2023-11-27 DIAGNOSIS — M5090 Cervical disc disorder, unspecified, unspecified cervical region: Secondary | ICD-10-CM | POA: Insufficient documentation

## 2023-11-27 DIAGNOSIS — E785 Hyperlipidemia, unspecified: Secondary | ICD-10-CM | POA: Insufficient documentation

## 2023-11-27 DIAGNOSIS — M5441 Lumbago with sciatica, right side: Secondary | ICD-10-CM | POA: Insufficient documentation

## 2023-11-27 DIAGNOSIS — E1151 Type 2 diabetes mellitus with diabetic peripheral angiopathy without gangrene: Secondary | ICD-10-CM | POA: Insufficient documentation

## 2023-11-27 DIAGNOSIS — G2581 Restless legs syndrome: Secondary | ICD-10-CM | POA: Insufficient documentation

## 2023-11-27 DIAGNOSIS — E66811 Obesity, class 1: Secondary | ICD-10-CM | POA: Insufficient documentation

## 2023-11-27 DIAGNOSIS — R7303 Prediabetes: Secondary | ICD-10-CM | POA: Insufficient documentation

## 2023-11-27 DIAGNOSIS — R232 Flushing: Secondary | ICD-10-CM | POA: Insufficient documentation

## 2023-11-27 DIAGNOSIS — G4733 Obstructive sleep apnea (adult) (pediatric): Secondary | ICD-10-CM | POA: Insufficient documentation

## 2023-11-27 MED ORDER — DEXAMETHASONE SODIUM PHOSPHATE 120 MG/30ML IJ SOLN
2.0000 mg | Freq: Once | INTRAMUSCULAR | Status: AC
  Administered 2023-11-27: 2 mg via INTRA_ARTICULAR

## 2023-11-28 NOTE — Progress Notes (Signed)
 She presents today chief complaint of a painful lesion beneath the fifth metatarsal phalangeal joint area as she points to it stating that it feels like she is walking on rocks right foot is the same but not as bad as the left.  She is a diabetic and she does not know her A1c currently.  Objective: Vital signs are stable alert oriented x 3 pulses are palpable.  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  Most painful on the left.  Assessment: Bursitis subfifth met head left.  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  Plan: Debridement of benign skin neoplasms.  Injected the bursa today 2 mg of dexamethasone  and local anesthetic.  Tolerated procedure well without complications was given both oral written home-going instructions will follow-up with me on an as-needed basis.

## 2023-12-04 ENCOUNTER — Other Ambulatory Visit (HOSPITAL_BASED_OUTPATIENT_CLINIC_OR_DEPARTMENT_OTHER): Payer: Self-pay | Admitting: Internal Medicine

## 2023-12-04 DIAGNOSIS — E2839 Other primary ovarian failure: Secondary | ICD-10-CM

## 2024-02-21 ENCOUNTER — Other Ambulatory Visit: Payer: Self-pay | Admitting: Internal Medicine

## 2024-02-21 DIAGNOSIS — Z1231 Encounter for screening mammogram for malignant neoplasm of breast: Secondary | ICD-10-CM

## 2024-03-24 ENCOUNTER — Ambulatory Visit
Admission: RE | Admit: 2024-03-24 | Discharge: 2024-03-24 | Disposition: A | Discharge status: Home / Self Care | Source: Ambulatory Visit | Attending: Internal Medicine | Admitting: Internal Medicine

## 2024-03-24 DIAGNOSIS — Z1231 Encounter for screening mammogram for malignant neoplasm of breast: Secondary | ICD-10-CM
